# Patient Record
Sex: Female | Born: 2000 | Race: Black or African American | Hispanic: No | Marital: Single | Smoking: Never smoker
Health system: Southern US, Community
[De-identification: ages and names within clinical notes are randomized; demographics above are authoritative.]

## PROBLEM LIST (undated history)

## (undated) DIAGNOSIS — L309 Dermatitis, unspecified: Secondary | ICD-10-CM

## (undated) DIAGNOSIS — T7840XA Allergy, unspecified, initial encounter: Secondary | ICD-10-CM

## (undated) HISTORY — DX: Dermatitis, unspecified: L30.9

## (undated) HISTORY — DX: Allergy, unspecified, initial encounter: T78.40XA

---
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
## 2020-02-09 DIAGNOSIS — Z20822 Contact with and (suspected) exposure to covid-19: Secondary | ICD-10-CM | POA: Diagnosis not present

## 2020-02-12 DIAGNOSIS — Z20822 Contact with and (suspected) exposure to covid-19: Secondary | ICD-10-CM | POA: Diagnosis not present

## 2020-02-15 ENCOUNTER — Ambulatory Visit: Payer: Medicaid Other | Admitting: Family Medicine

## 2020-02-29 DIAGNOSIS — R82998 Other abnormal findings in urine: Secondary | ICD-10-CM | POA: Diagnosis not present

## 2020-02-29 DIAGNOSIS — Z1152 Encounter for screening for COVID-19: Secondary | ICD-10-CM | POA: Diagnosis not present

## 2020-04-06 ENCOUNTER — Ambulatory Visit: Payer: Medicaid Other | Admitting: Family Medicine

## 2020-04-06 ENCOUNTER — Encounter: Payer: Self-pay | Admitting: Family Medicine

## 2020-04-26 DIAGNOSIS — N898 Other specified noninflammatory disorders of vagina: Secondary | ICD-10-CM | POA: Diagnosis not present

## 2020-06-21 ENCOUNTER — Encounter: Payer: Self-pay | Admitting: *Deleted

## 2020-07-11 ENCOUNTER — Encounter: Payer: Self-pay | Admitting: Obstetrics and Gynecology
